# Patient Record
Sex: Female | Born: 1975 | Marital: Married | Smoking: Never smoker
Health system: Southern US, Community
[De-identification: ages and names within clinical notes are randomized; demographics above are authoritative.]

## PROBLEM LIST (undated history)

## (undated) DIAGNOSIS — N926 Irregular menstruation, unspecified: Secondary | ICD-10-CM

## (undated) DIAGNOSIS — Z319 Encounter for procreative management, unspecified: Secondary | ICD-10-CM

## (undated) DIAGNOSIS — R87629 Unspecified abnormal cytological findings in specimens from vagina: Secondary | ICD-10-CM

## (undated) HISTORY — DX: Irregular menstruation, unspecified: N92.6

## (undated) HISTORY — DX: Unspecified abnormal cytological findings in specimens from vagina: R87.629

## (undated) HISTORY — DX: Encounter for procreative management, unspecified: Z31.9

---

## 2012-03-30 ENCOUNTER — Encounter: Payer: Self-pay | Admitting: Obstetrics and Gynecology

## 2012-05-13 ENCOUNTER — Ambulatory Visit: Payer: Self-pay

## 2012-05-13 LAB — CBC WITH DIFFERENTIAL/PLATELET
Basophil #: 0 10*3/uL (ref 0.0–0.1)
Eosinophil #: 0 10*3/uL (ref 0.0–0.7)
HCT: 35.7 % (ref 35.0–47.0)
HGB: 12.1 g/dL (ref 12.0–16.0)
Lymphocyte %: 15.3 %
MCH: 31.8 pg (ref 26.0–34.0)
MCHC: 34 g/dL (ref 32.0–36.0)
MCV: 94 fL (ref 80–100)
Monocyte %: 6.1 %
Neutrophil #: 7.2 10*3/uL — ABNORMAL HIGH (ref 1.4–6.5)
Platelet: 199 10*3/uL (ref 150–440)

## 2012-05-14 ENCOUNTER — Inpatient Hospital Stay: Payer: Self-pay | Admitting: Obstetrics & Gynecology

## 2012-05-15 LAB — HEMATOCRIT: HCT: 34.1 % — ABNORMAL LOW (ref 35.0–47.0)

## 2012-05-15 LAB — HEMOGLOBIN: HGB: 11.5 g/dL — ABNORMAL LOW (ref 12.0–16.0)

## 2014-07-08 NOTE — Op Note (Signed)
PATIENT NAME:  Kristina PicaROMESBURG, Grover D MR#:  [id number] DATE OF BIRTH:  12-28-1975  DATE OF PROCEDURE:  05/14/2012  PREOPERATIVE DIAGNOSIS:  Intrauterine pregnancy, [redacted] weeks gestation, breech presentation.  POSTOPERATIVE DIAGNOSIS:  Intrauterine pregnancy, [redacted] weeks gestation, breech presentation.  PROCEDURE PERFORMED: Low transverse cesarean section.   SURGEON: Deloris Pinghilip J. Luella Cookosenow, MD   FIRST ASSISTANT:  Lurline HareStephen Johnson  NEONATOLOGIST: Dr. Beckie Saltsasnadi  OPERATIVE FINDINGS: 8 pounds, 2 ounce female infant - [place] Derrythan, delivered.  Bicornuate uterus.  DESCRIPTION OF PROCEDURE: After adequate conduction anesthesia, the patient was prepped and draped in routine fashion. A skin incision in modified Pfannenstiel fashion was made and carried down the various layers and the peritoneal cavity was entered. Bladder flap was created and the bladder was pushed down. A low transverse incision then made and the above-described infant was delivered as a breech without incident. The placenta was removed manually. The cervix was "cracked". The uterus was then closed in continuous lock suture of chromic 1. All areas of surgery were inspected and found to be hemostatic. The peritoneum was lavaged with copious amounts of saline. It should be noted that the patient had a bicornuate uterus. Rectus muscle was reapproximated in the midline. On-Q pump was placed. The fascia was reapproximated with continuous suture of Maxon. The skin was closed with skin staples.        Estimated blood loss 500 mL. The patient tolerated the procedure well and left the operating room in good condition. Sponge and needle counts were said to be correct at the end of the procedure.  ____________________________ Deloris PingPhilip J. Luella Cookosenow, MD pjr:sb D: 05/14/2012 10:09:29 ET T: 05/14/2012 10:31:51 ET JOB#: [id number]  cc: Deloris PingPhilip J. Luella Cookosenow, MD, <Dictator> Towana BadgerPHILIP J Diontay Rosencrans MD ELECTRONICALLY SIGNED 05/21/2012 7:30

## 2014-10-17 ENCOUNTER — Ambulatory Visit (INDEPENDENT_AMBULATORY_CARE_PROVIDER_SITE_OTHER): Payer: 59 | Admitting: Adult Health

## 2014-10-17 ENCOUNTER — Other Ambulatory Visit (HOSPITAL_COMMUNITY)
Admission: RE | Admit: 2014-10-17 | Discharge: 2014-10-17 | Disposition: A | Discharge status: Home / Self Care | Payer: 59 | Source: Ambulatory Visit | Attending: Adult Health | Admitting: Adult Health

## 2014-10-17 ENCOUNTER — Encounter: Payer: Self-pay | Admitting: Adult Health

## 2014-10-17 VITALS — BP 100/60 | HR 68 | Ht 67.0 in | Wt 136.0 lb

## 2014-10-17 DIAGNOSIS — Z01419 Encounter for gynecological examination (general) (routine) without abnormal findings: Secondary | ICD-10-CM | POA: Diagnosis not present

## 2014-10-17 DIAGNOSIS — Z1151 Encounter for screening for human papillomavirus (HPV): Secondary | ICD-10-CM | POA: Diagnosis present

## 2014-10-17 DIAGNOSIS — Z319 Encounter for procreative management, unspecified: Secondary | ICD-10-CM

## 2014-10-17 DIAGNOSIS — N926 Irregular menstruation, unspecified: Secondary | ICD-10-CM

## 2014-10-17 HISTORY — DX: Encounter for procreative management, unspecified: Z31.9

## 2014-10-17 HISTORY — DX: Irregular menstruation, unspecified: N92.6

## 2014-10-17 NOTE — Patient Instructions (Signed)
Physical in 1 year Mammogram at 39 Call me with next period

## 2014-10-17 NOTE — Progress Notes (Signed)
Patient ID: Kristina Cook, female   DOB: 07/03/1975, 39 y.o.   MRN: [id number] History of Present Illness: Kristina Cook is a 39 year old white female, married in for a well woman gyn exam and pap, she is new to this practice.   Current Medications, Allergies, Past Medical History, Past Surgical History, Family History and Social History were reviewed in [employer] record.     Review of Systems:  Patient denies any headaches, hearing loss, fatigue, blurred vision, shortness of breath, chest pain, abdominal pain, problems with bowel movements, urination, or intercourse. No joint pain or mood swings.She is having irregular cycles and desires another pregnancy, she 37.17 year old son. Had period 4/14 then 5/12 and last one 6/27.  Physical Exam:BP 100/60 mmHg  Pulse 68  Ht  (1.702 m)  Wt 136 lb (61.689 kg)  BMI 21.30 kg/m2  LMP 09/12/2014 General:  Well developed, well nourished, no acute distress Skin:  Warm and dry Neck:  Midline trachea, normal thyroid, good ROM, no lymphadenopathy Lungs; Clear to auscultation bilaterally Breast:  No dominant palpable mass, retraction, or nipple discharge Cardiovascular: Regular rate and rhythm Abdomen:  Soft, non tender, no hepatosplenomegaly Pelvic:  External genitalia is normal in appearance, no lesions.  The vagina is normal in appearance. Urethra has no lesions or masses. The cervix is smooth, pap with HPV performed.  Uterus is felt to be normal size, shape, and contour.  No adnexal masses or tenderness noted.Bladder is non tender, no masses felt. Extremities/musculoskeletal:  No swelling or varicosities noted, no clubbing or cyanosis Psych:  No mood changes, alert and cooperative,seems happy Discussed timing of sex and ovulation and may not be ovulating, to call with next period, may check progesterone level.  Impression: Well woman gyn exam with pap Irregular periods Desires pregnancy    Plan: Call with next  period Physical in 1 year Have sex day 7-30 of cycle, lay there after sex and pee before sex

## 2014-10-19 LAB — CYTOLOGY - PAP

## 2015-01-03 IMAGING — US US OB DETAIL+14 WK - NRPT [facility]
1 series · 14 of 28 positions shown · non-contrast
Comparison: none

[Series 1: us ob detail+14 wk - nrpt [facility] · 0.26mm/px · 14 of 145 slices shown]
[im 6/145]
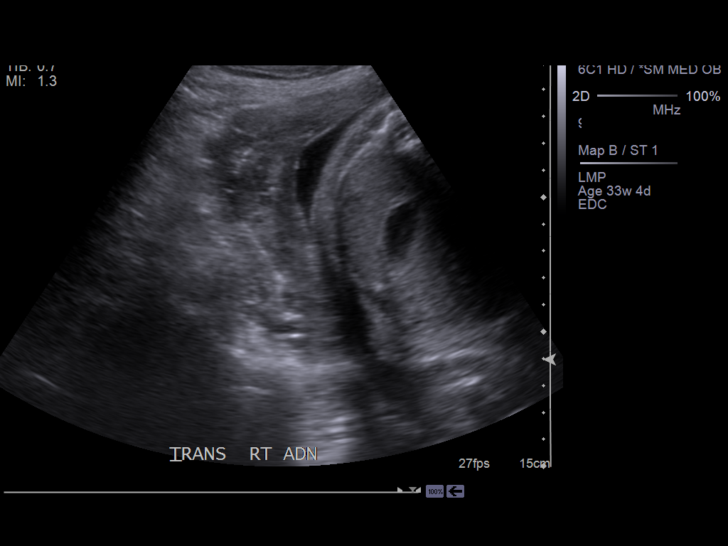
[im 17/145]
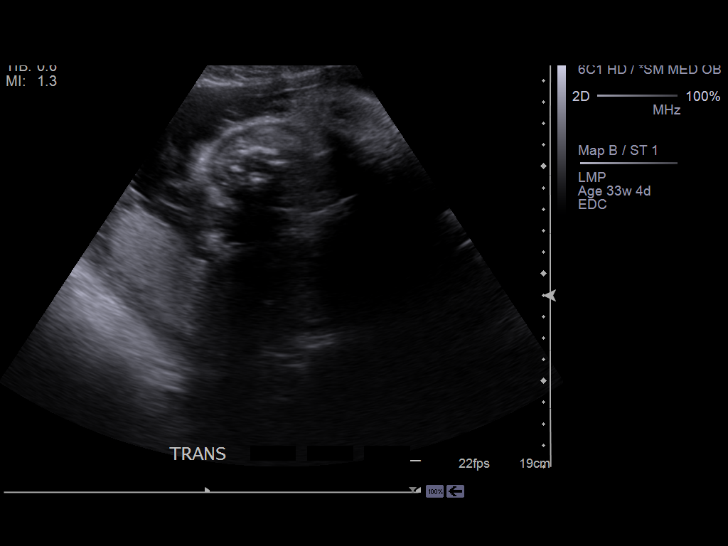
[im 27/145]
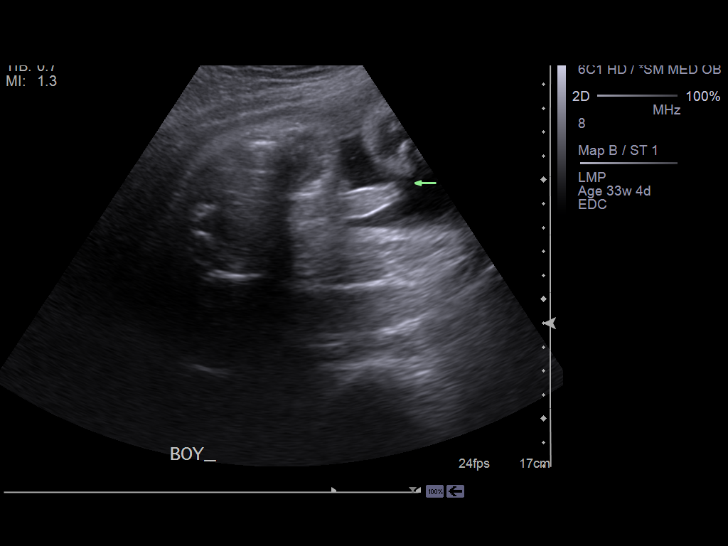
[im 38/145]
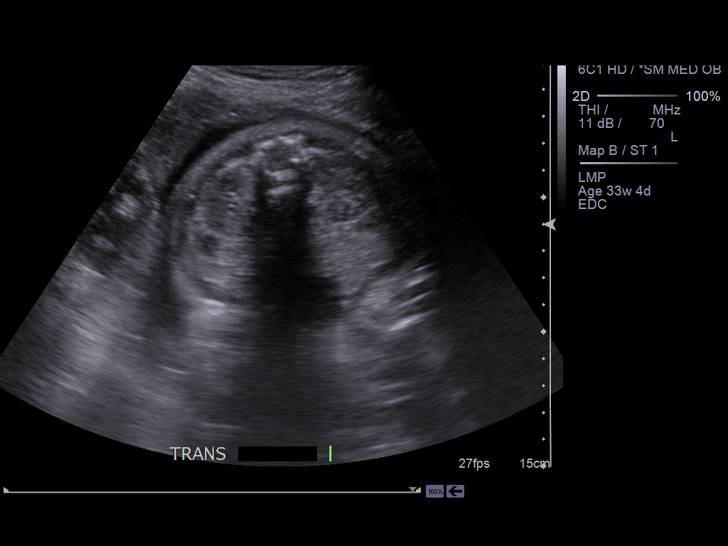
[im 49/145]
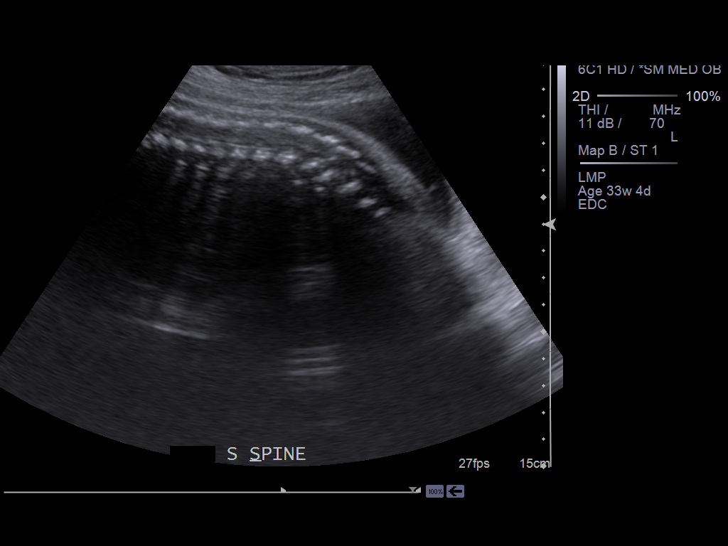
[im 59/145]
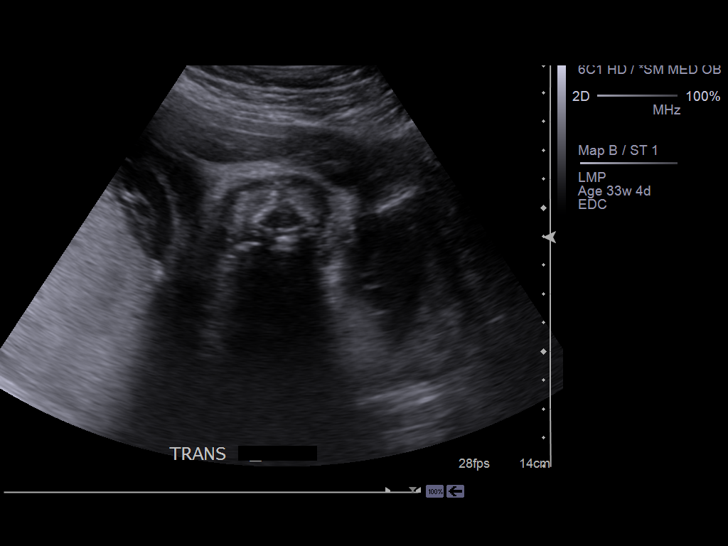
[im 70/145]
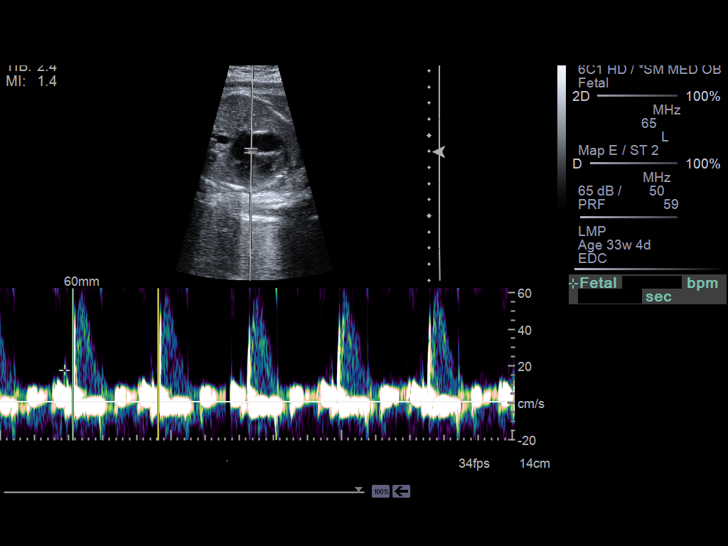
[im 81/145]
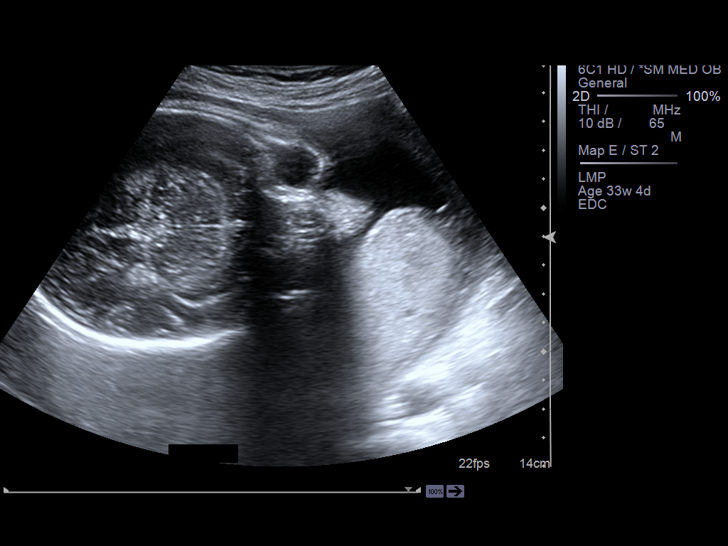
[im 91/145]
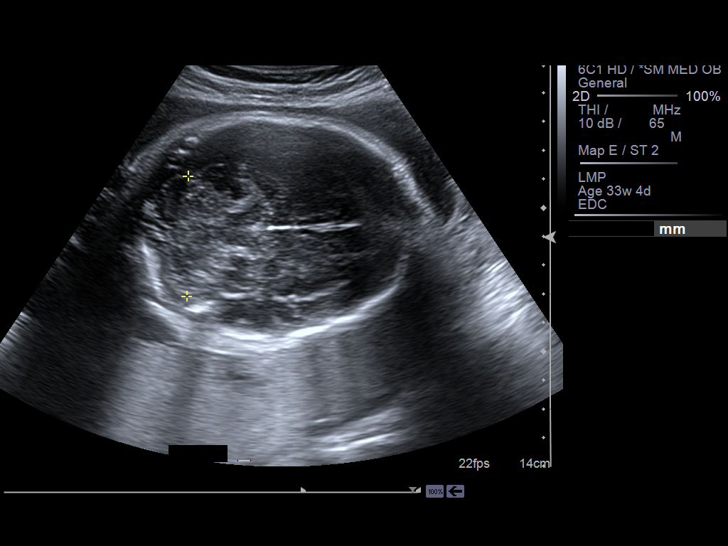
[im 102/145]
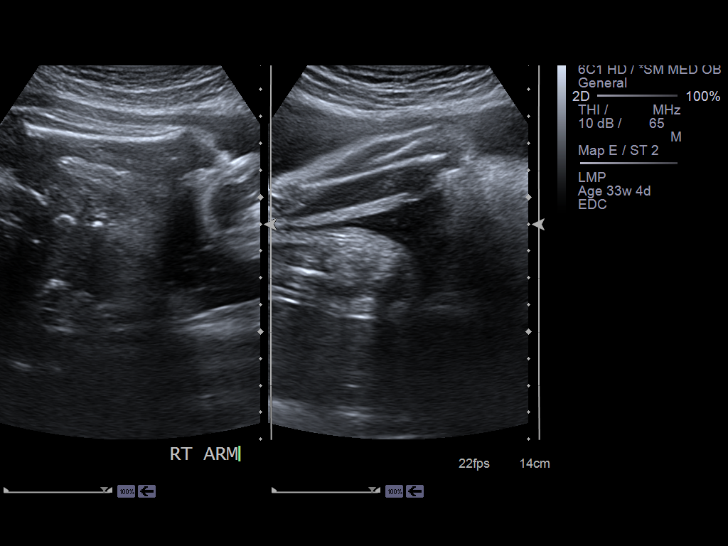
[im 113/145]
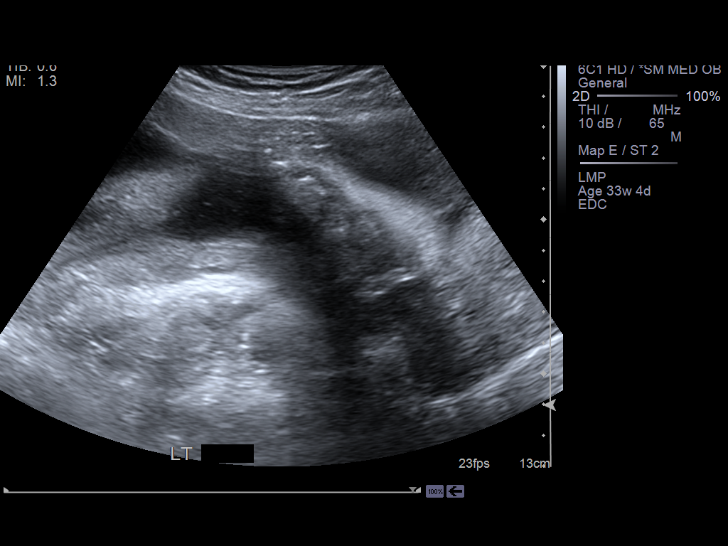
[im 123/145]
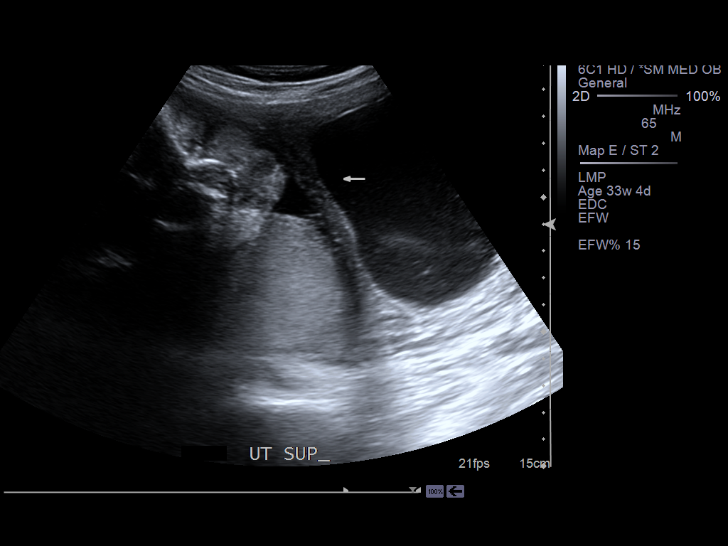
[im 134/145]
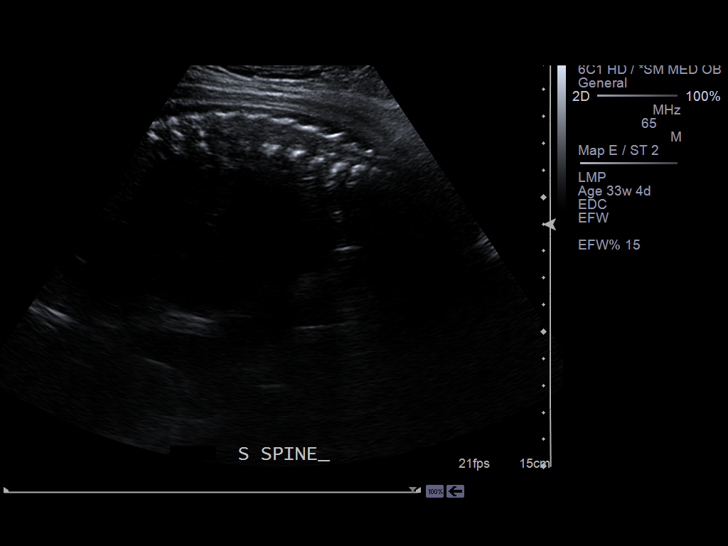
[im 145/145]
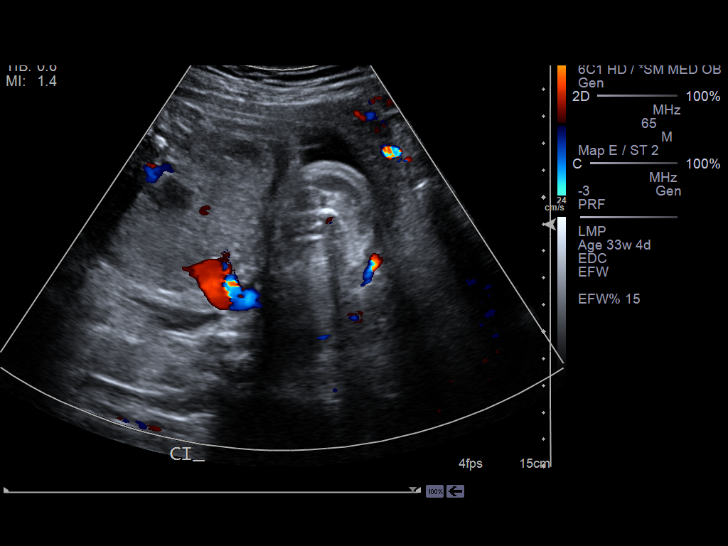

[14 of 28 positions shown; findings below may reference images not displayed]

IMAGES IMPORTED FROM THE [employer] WORKFLOW SYSTEM
NO DICTATION FOR STUDY
